# Patient Record
Sex: Female | Born: 1965 | Race: White | Hispanic: No | Smoking: Never smoker
Health system: Southern US, Community
[De-identification: ages and names within clinical notes are randomized; demographics above are authoritative.]

## PROBLEM LIST (undated history)

## (undated) DIAGNOSIS — F329 Major depressive disorder, single episode, unspecified: Secondary | ICD-10-CM

## (undated) DIAGNOSIS — I341 Nonrheumatic mitral (valve) prolapse: Secondary | ICD-10-CM

## (undated) DIAGNOSIS — F32A Depression, unspecified: Secondary | ICD-10-CM

## (undated) DIAGNOSIS — M549 Dorsalgia, unspecified: Secondary | ICD-10-CM

## (undated) HISTORY — PX: TONSILLECTOMY AND ADENOIDECTOMY: SHX28

## (undated) HISTORY — PX: INDUCED ABORTION: SHX677

## (undated) HISTORY — DX: Major depressive disorder, single episode, unspecified: F32.9

## (undated) HISTORY — DX: Dorsalgia, unspecified: M54.9

## (undated) HISTORY — PX: OTHER SURGICAL HISTORY: SHX169

## (undated) HISTORY — DX: Depression, unspecified: F32.A

## (undated) HISTORY — DX: Nonrheumatic mitral (valve) prolapse: I34.1

---

## 1992-01-06 DIAGNOSIS — I341 Nonrheumatic mitral (valve) prolapse: Secondary | ICD-10-CM

## 1992-01-06 HISTORY — DX: Nonrheumatic mitral (valve) prolapse: I34.1

## 2012-09-29 ENCOUNTER — Ambulatory Visit (INDEPENDENT_AMBULATORY_CARE_PROVIDER_SITE_OTHER): Payer: Medicaid Other | Admitting: Sports Medicine

## 2012-09-29 ENCOUNTER — Encounter (HOSPITAL_COMMUNITY): Payer: Self-pay | Admitting: Psychiatry

## 2012-09-29 ENCOUNTER — Encounter: Payer: Self-pay | Admitting: Sports Medicine

## 2012-09-29 ENCOUNTER — Ambulatory Visit (INDEPENDENT_AMBULATORY_CARE_PROVIDER_SITE_OTHER): Payer: Medicaid Other

## 2012-09-29 ENCOUNTER — Ambulatory Visit (INDEPENDENT_AMBULATORY_CARE_PROVIDER_SITE_OTHER): Payer: 59 | Admitting: Psychiatry

## 2012-09-29 VITALS — BP 116/72 | HR 83 | Ht 61.5 in | Wt 124.0 lb

## 2012-09-29 VITALS — BP 125/83 | HR 72 | Wt 121.0 lb

## 2012-09-29 DIAGNOSIS — IMO0002 Reserved for concepts with insufficient information to code with codable children: Secondary | ICD-10-CM

## 2012-09-29 DIAGNOSIS — M7061 Trochanteric bursitis, right hip: Secondary | ICD-10-CM | POA: Insufficient documentation

## 2012-09-29 DIAGNOSIS — M5416 Radiculopathy, lumbar region: Secondary | ICD-10-CM | POA: Insufficient documentation

## 2012-09-29 DIAGNOSIS — F411 Generalized anxiety disorder: Secondary | ICD-10-CM

## 2012-09-29 DIAGNOSIS — M5137 Other intervertebral disc degeneration, lumbosacral region: Secondary | ICD-10-CM

## 2012-09-29 DIAGNOSIS — M47817 Spondylosis without myelopathy or radiculopathy, lumbosacral region: Secondary | ICD-10-CM

## 2012-09-29 DIAGNOSIS — M7711 Lateral epicondylitis, right elbow: Secondary | ICD-10-CM | POA: Insufficient documentation

## 2012-09-29 DIAGNOSIS — M7710 Lateral epicondylitis, unspecified elbow: Secondary | ICD-10-CM

## 2012-09-29 DIAGNOSIS — M76899 Other specified enthesopathies of unspecified lower limb, excluding foot: Secondary | ICD-10-CM

## 2012-09-29 DIAGNOSIS — F410 Panic disorder [episodic paroxysmal anxiety] without agoraphobia: Secondary | ICD-10-CM

## 2012-09-29 MED ORDER — PREDNISONE 50 MG PO TABS: ORAL_TABLET | ORAL | Status: AC

## 2012-09-29 MED ORDER — NAPROXEN 500 MG PO TABS: 500.0000 mg | ORAL_TABLET | Freq: Two times a day (BID) | ORAL | Status: AC

## 2012-09-29 MED ORDER — PAROXETINE HCL 30 MG PO TABS: 60.0000 mg | ORAL_TABLET | Freq: Two times a day (BID) | ORAL | Status: DC

## 2012-09-29 MED ORDER — BUSPIRONE HCL 15 MG PO TABS: 15.0000 mg | ORAL_TABLET | Freq: Three times a day (TID) | ORAL | Status: DC

## 2012-09-29 NOTE — Progress Notes (Signed)
Subjective:    CC: Hip pain, back pain, elbow pain  HPI: Right elbow pain: Localized over the origin of the common extensor tendon present for weeks, worse with activity. Moderate, persistent. No radiation.  Low back pain: Present on and off for years, worse when sitting, worse with Valsalva, worse with flexion, radiates down right leg in an L5/S1 distribution. Has not had any specific treatment for this in the past.  Right hip pain: Localized over the greater trochanter, saw orthopedist in the past and had an injection, never did any physical therapy, or hip abductor rehabilitation.  Past medical history, Surgical history, Family history not pertinant except as noted below, Social history, Allergies, and medications have been entered into the medical record, reviewed, and no changes needed.   Review of Systems: No headache, visual changes, nausea, vomiting, diarrhea, constipation, dizziness, abdominal pain, skin rash, fevers, chills, night sweats, weight loss, swollen lymph nodes, body aches, joint swelling, muscle aches, chest pain, shortness of breath, mood changes, visual or auditory hallucinations.   Objective:   General: Well Developed, well nourished, and in no acute distress.  Neuro/Psych: Alert and oriented x3, extra-ocular muscles intact, able to move all 4 extremities, sensation grossly intact. Skin: Warm and dry, no rashes noted.  Respiratory: Not using accessory muscles, speaking in full sentences, trachea midline.  Cardiovascular: Pulses palpable, no extremity edema. Abdomen: Does not appear distended. Right Hip: ROM IR: 45 Deg, ER: 45 Deg, Flexion: 120 Deg, Extension: 100 Deg, Abduction: 45 Deg, Adduction: 45 Deg Strength IR: 5/5, ER: 5/5, Flexion: 5/5, Extension: 5/5, Abduction: 5/5, Adduction: 5/5 Pelvic alignment unremarkable to inspection and palpation. Standing hip rotation and gait without trendelenburg sign / unsteadiness. Tender to palpation over the greater  trochanter, weak hip abduction. No pain with FABER or FADIR. No SI joint tenderness and normal minimal SI movement. Back Exam:  Inspection: Unremarkable  Motion: Flexion 45 deg, Extension 45 deg, Side Bending to 45 deg bilaterally,  Rotation to 45 deg bilaterally  SLR laying: Negative  XSLR laying: Negative  Palpable tenderness: None. FABER: negative. Sensory change: Gross sensation intact to all lumbar and sacral dermatomes.  Reflexes: 2+ at both patellar tendons, 2+ at achilles tendons, Babinski's downgoing.  Strength at foot  Plantar-flexion: 5/5 Dorsi-flexion: 5/5 Eversion: 5/5 Inversion: 5/5  Leg strength  Quad: 5/5 Hamstring: 5/5 Hip flexor: 5/5 Hip abductors: 5/5  Gait unremarkable. Right Elbow: Unremarkable to inspection. Range of motion full pronation, supination, flexion, extension. Strength is full to all of the above directions Stable to varus, valgus stress. Negative moving valgus stress test. Tender to palpation at the origin of the common extensor tendon. Ulnar nerve does not sublux. Negative cubital tunnel Tinel's.  Procedure:  Injection of right greater trochanter Consent obtained and verified. Time-out conducted. Noted no overlying erythema, induration, or other signs of local infection. Skin prepped in a sterile fashion. Topical analgesic spray: Ethyl chloride. Completed without difficulty. Meds: Spinal needle advanced to the greater trochanter, concordant pain elicited, 1 cc Kenalog 40, 4 cc lidocaine injected easily. Pain immediately improved suggesting accurate placement of the medication. Advised to call if fevers/chills, erythema, induration, drainage, or persistent bleeding.  Procedure: Real-time Ultrasound Guided Injection of right common extensor tendon origin Device: GE Logiq E  Verbal informed consent obtained.  Time-out conducted.  Noted no overlying erythema, induration, or other signs of local infection.  Skin prepped in a sterile fashion.    Local anesthesia: Topical Ethyl chloride.  With sterile technique and under real time  ultrasound guidance:  A total of 1 cc Kenalog 40, 3 cc lidocaine injected both deep and superficial to the common extensor tendon origin. Completed without difficulty  Pain immediately resolved suggesting accurate placement of the medication.  Advised to call if fevers/chills, erythema, induration, drainage, or persistent bleeding.  Images permanently stored and available for review in the ultrasound unit.  Impression: Technically successful ultrasound guided injection.  X-rays of the lumbar spine were personally reviewed and show loss of disc height at L4-L5 as well as bilateral L5-S1 facet spondylosis.  Impression and Recommendations:   This case required medical decision making of moderate complexity.

## 2012-09-29 NOTE — Assessment & Plan Note (Signed)
Injection as above. Counter force brace. Home exercises/formal physical therapy.

## 2012-09-29 NOTE — Assessment & Plan Note (Signed)
Injection as above. Aggressive hip abductor rehabilitation with physical therapy. Return in one month.
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
## 2012-09-29 NOTE — Assessment & Plan Note (Signed)
Clinically right L5 Prednisone, naproxen. Formal physical therapy. X-rays. Return in 4 weeks.

## 2012-10-08 DIAGNOSIS — F411 Generalized anxiety disorder: Secondary | ICD-10-CM | POA: Insufficient documentation

## 2012-10-08 DIAGNOSIS — F410 Panic disorder [episodic paroxysmal anxiety] without agoraphobia: Secondary | ICD-10-CM | POA: Insufficient documentation

## 2012-10-25 ENCOUNTER — Ambulatory Visit (INDEPENDENT_AMBULATORY_CARE_PROVIDER_SITE_OTHER): Payer: 59 | Admitting: Licensed Clinical Social Worker

## 2012-10-25 DIAGNOSIS — F4323 Adjustment disorder with mixed anxiety and depressed mood: Secondary | ICD-10-CM
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
## 2012-10-27 ENCOUNTER — Ambulatory Visit: Payer: Medicaid Other | Admitting: Sports Medicine

## 2012-10-27 ENCOUNTER — Ambulatory Visit (HOSPITAL_COMMUNITY): Payer: Medicaid Other | Admitting: Psychiatry

## 2012-11-03 ENCOUNTER — Ambulatory Visit (HOSPITAL_COMMUNITY): Payer: Medicaid Other | Admitting: Licensed Clinical Social Worker

## 2012-11-10 ENCOUNTER — Other Ambulatory Visit: Payer: Self-pay

## 2012-11-10 ENCOUNTER — Ambulatory Visit (INDEPENDENT_AMBULATORY_CARE_PROVIDER_SITE_OTHER): Payer: 59 | Admitting: Licensed Clinical Social Worker

## 2012-11-10 DIAGNOSIS — F411 Generalized anxiety disorder: Secondary | ICD-10-CM

## 2012-11-10 NOTE — Progress Notes (Deleted)
   THERAPIST PROGRESS NOTE  Session Time: 11:00 12:50  Participation Level: Active  Behavioral Response: CasualAlertEuthymic  Type of Therapy: Individual Therapy  Treatment Goals addressed: Anxiety  Interventions: Motivational Interviewing and Supportive  Summary: Jean Nolan is a 47 y.o. female who presents with dad.  Asked dad to come in also because needed to tell Jean Nolan that I was retiring by Thanksgiving.  They decided to continue to see me until I left and would take a referral to a child therapist in [place].  Dad reports that there are no problems at the moment.  Court date is next week.  Concerned about results for [facility].  Have a definite impression that dad is careful not to talk about her mother or any information about the case in front of Jean Nolan.  Jean Nolan was quiet as she was last time.  Father states that once she gets to know someone she is much more verbal.  She wants more time with dad and feels good about Jean Nolan who is kind to her.  She finds her mother is more irritable - she likes her husband Jean Nolan - he is good to her.  She had fun on Halloween - dressed up as a biker girl - her older brother dressed up as a Stage manager and her youngest brother dressed up as [place] from [place].  She got all A's and one B on her report card.  She reported that she went to [facility] office yesterday.  She does not know whether she will testify in court or not.  Discussed what that might feel like but told her usually you would just speak to the judge..   Suicidal/Homicidal: No  Therapist Response: Discussed with dad that she should not testify in open court - should be in judges chambers.  The AdLitem has been chosen so this court date may be postponed.  Plan: Return again in 1 weeks.  Diagnosis: Axis I: Generalized Anxiety Disorder    Axis II: No diagnosis    Jean Nolan,JUDITH A, LCSW 11/10/2012

## 2012-11-17 ENCOUNTER — Ambulatory Visit (HOSPITAL_COMMUNITY): Payer: Medicaid Other | Admitting: Licensed Clinical Social Worker

## 2012-11-18 ENCOUNTER — Ambulatory Visit (HOSPITAL_COMMUNITY): Payer: Medicaid Other | Admitting: Psychiatry

## 2012-11-26 NOTE — Progress Notes (Signed)
   THERAPIST PROGRESS NOTE  Session Time: 8:00 - 9:00  Participation Level: Active  Behavioral Response: CasualAlertEuthymic  Type of Therapy: Individual Therapy  Treatment Goals addressed: Anxiety  Interventions: Motivational Interviewing  Summary: Jean Nolan is a 47 y.o. female who presents with .   Suicidal/Homicidal: No  Therapist Response:   Plan: Return again in 2 weeks.  Diagnosis: Axis I: Generalized Anxiety Disorder    Axis II: Deferred    Faiz Weber,JUDITH A, LCSW 11/26/2012

## 2012-12-08 ENCOUNTER — Telehealth (HOSPITAL_COMMUNITY): Payer: Self-pay

## 2012-12-08 DIAGNOSIS — F410 Panic disorder [episodic paroxysmal anxiety] without agoraphobia: Secondary | ICD-10-CM

## 2012-12-08 DIAGNOSIS — F411 Generalized anxiety disorder: Secondary | ICD-10-CM

## 2012-12-09 ENCOUNTER — Encounter (HOSPITAL_COMMUNITY): Payer: Self-pay | Admitting: Psychiatry

## 2012-12-09 MED ORDER — PAROXETINE HCL 30 MG PO TABS: 30.0000 mg | ORAL_TABLET | Freq: Two times a day (BID) | ORAL | Status: AC

## 2012-12-09 MED ORDER — BUSPIRONE HCL 15 MG PO TABS: 15.0000 mg | ORAL_TABLET | Freq: Three times a day (TID) | ORAL | Status: AC

## 2012-12-09 NOTE — Telephone Encounter (Signed)
Patient is stable on current medications. She will find a provider in [place]. Will provide 30 day supply of medications with 1 refill.  as it will take 3 weeks to hear back for insurance.  Will discharge from practice at this time.

## 2013-01-12 ENCOUNTER — Telehealth (HOSPITAL_COMMUNITY): Payer: Self-pay

## 2013-01-12 NOTE — Telephone Encounter (Signed)
Authorized refills of paxil 3 10mg  BID.

## 2014-10-21 IMAGING — CR DG LUMBAR SPINE COMPLETE 4+V
5 series · 5 of 5 positions shown · non-contrast
Comparison: None.

CLINICAL DATA: Low back and bilateral leg pain.

EXAM:
LUMBAR SPINE - COMPLETE 4+ VIEW

[view not recorded (1 of 5)]
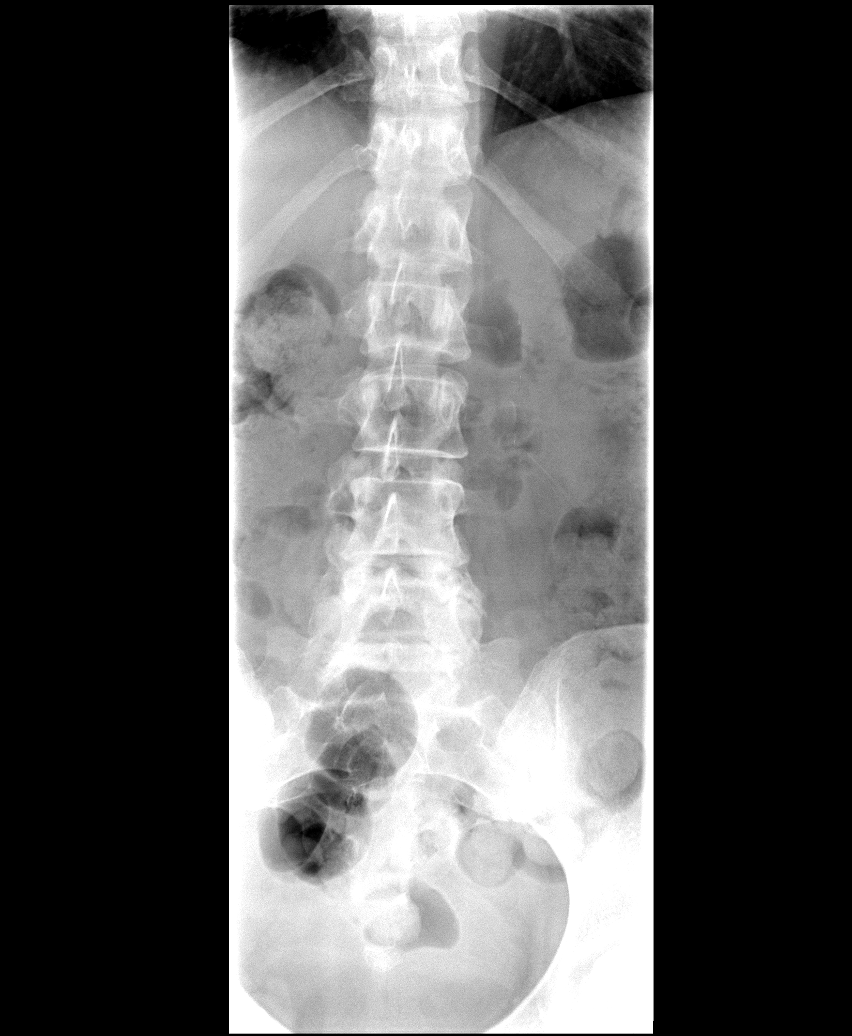

[view not recorded (2 of 5)]
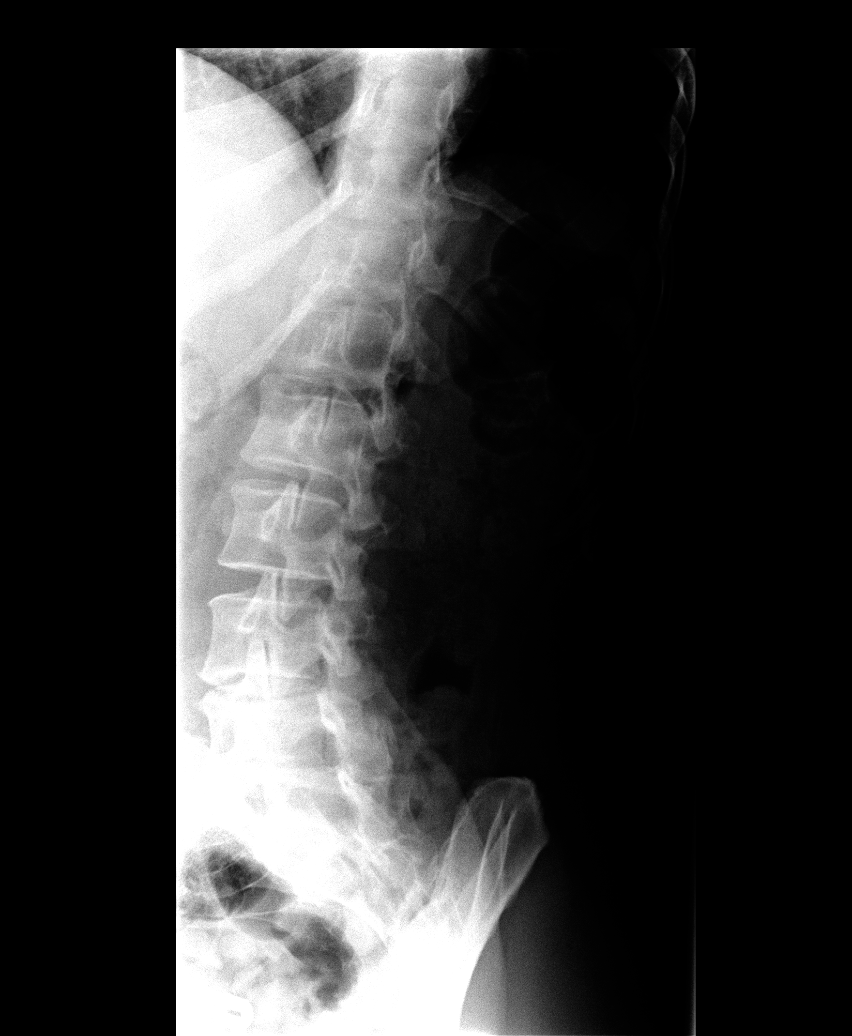

[view not recorded (3 of 5)]
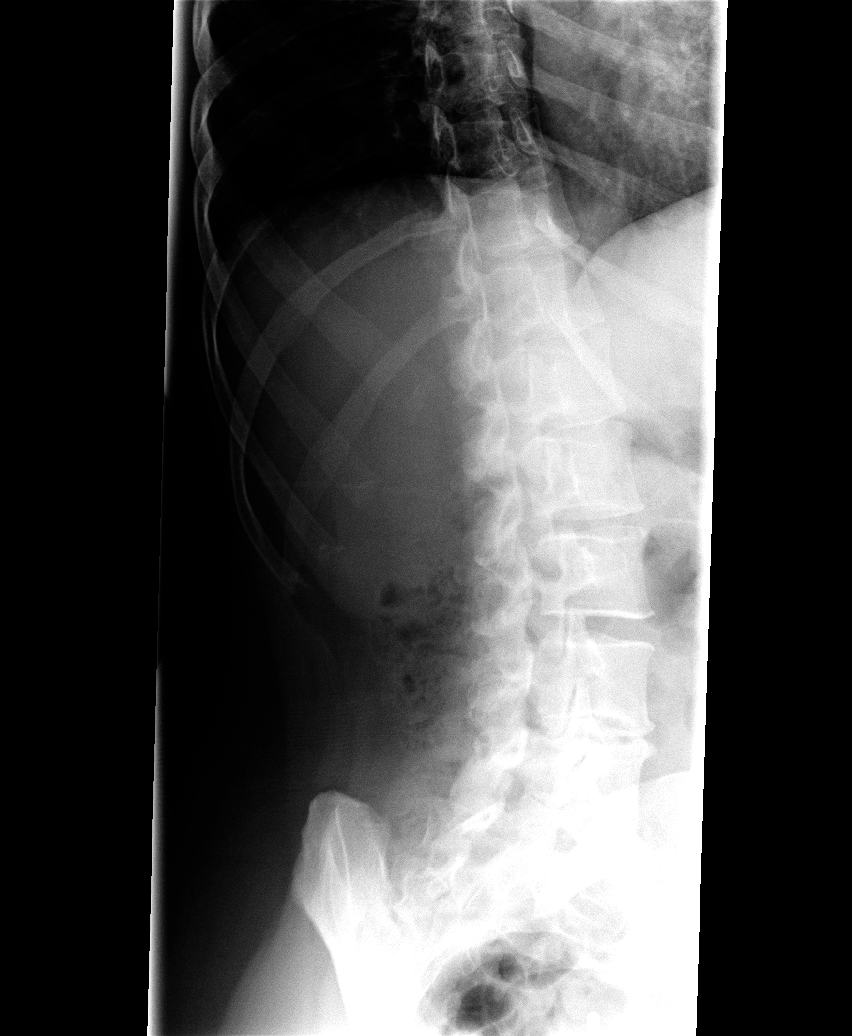

[view not recorded (4 of 5)]
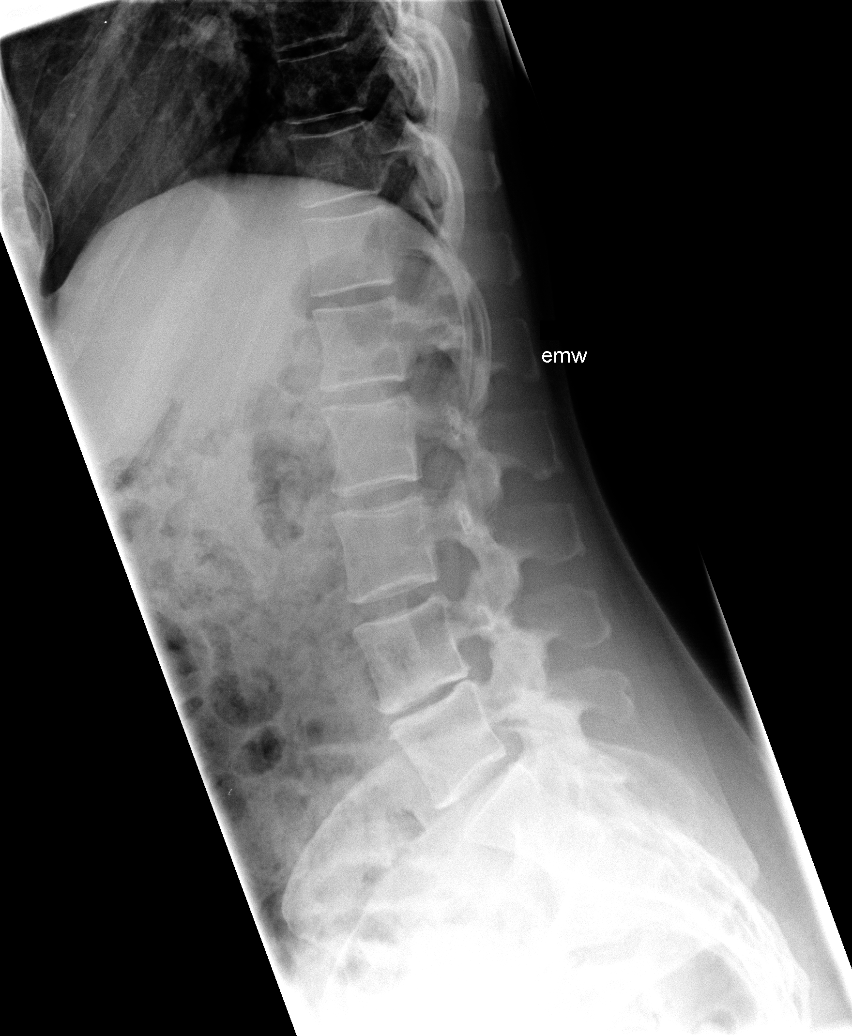

[view not recorded (5 of 5)]
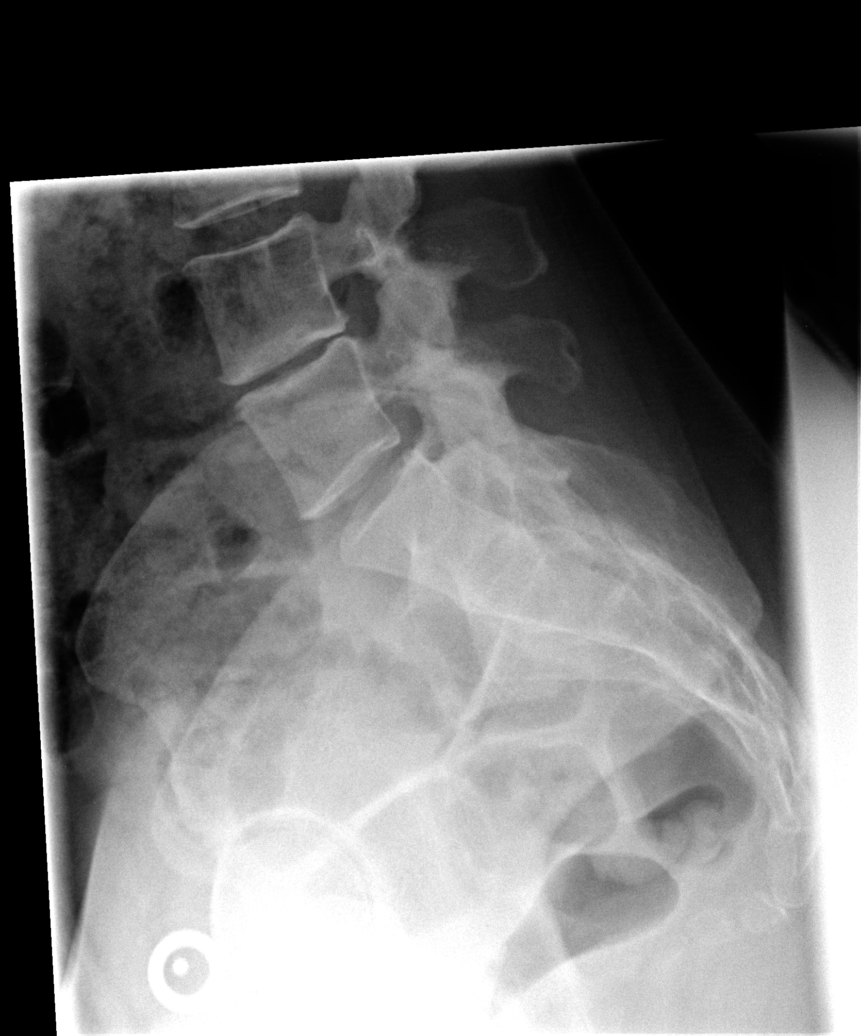

[5 of 5 positions shown; findings below may reference images not displayed]

FINDINGS: There is disc space narrowing at L4-5 with sclerosis of the
endplates. There is moderate bilateral facet arthritis at L5-S1.
There is no spondylolisthesis or bone destruction. No fractures.
IMPRESSION: Degenerative disc disease at L4-5. Degenerative facet arthritis at
L5-S1.
